# Patient Record
Sex: Male | Born: 1935 | Race: White | Hispanic: No
Health system: Southern US, Community
[De-identification: ages and names within clinical notes are randomized; demographics above are authoritative.]

---

## 2014-03-15 ENCOUNTER — Inpatient Hospital Stay: Payer: Self-pay | Admitting: Internal Medicine

## 2014-03-15 LAB — PROTIME-INR
INR: 1.3
Prothrombin Time: 15.5 secs — ABNORMAL HIGH (ref 11.5–14.7)

## 2014-03-15 LAB — CBC
HCT: 44.5 % (ref 40.0–52.0)
HGB: 14.3 g/dL (ref 13.0–18.0)
MCH: 29.5 pg (ref 26.0–34.0)
MCHC: 32.2 g/dL (ref 32.0–36.0)
MCV: 92 fL (ref 80–100)
PLATELETS: 154 10*3/uL (ref 150–440)
RBC: 4.85 10*6/uL (ref 4.40–5.90)
RDW: 14.9 % — AB (ref 11.5–14.5)
WBC: 16.5 10*3/uL — AB (ref 3.8–10.6)

## 2014-03-15 LAB — COMPREHENSIVE METABOLIC PANEL
ALK PHOS: 48 U/L
ANION GAP: 4 — AB (ref 7–16)
AST: 16 U/L (ref 15–37)
Albumin: 3.3 g/dL — ABNORMAL LOW (ref 3.4–5.0)
BILIRUBIN TOTAL: 0.7 mg/dL (ref 0.2–1.0)
BUN: 28 mg/dL — ABNORMAL HIGH (ref 7–18)
CO2: 30 mmol/L (ref 21–32)
Calcium, Total: 8.4 mg/dL — ABNORMAL LOW (ref 8.5–10.1)
Chloride: 101 mmol/L (ref 98–107)
Creatinine: 2.28 mg/dL — ABNORMAL HIGH (ref 0.60–1.30)
EGFR (African American): 36 — ABNORMAL LOW
EGFR (Non-African Amer.): 30 — ABNORMAL LOW
Glucose: 210 mg/dL — ABNORMAL HIGH (ref 65–99)
OSMOLALITY: 282 (ref 275–301)
Potassium: 5 mmol/L (ref 3.5–5.1)
SGPT (ALT): 25 U/L
SODIUM: 135 mmol/L — AB (ref 136–145)
TOTAL PROTEIN: 6.9 g/dL (ref 6.4–8.2)

## 2014-03-15 LAB — TROPONIN I
Troponin-I: <0.02 ng/mL
Troponin-I: <0.02 ng/mL
Troponin-I: <0.02 ng/mL

## 2014-03-15 LAB — URINALYSIS, COMPLETE
BACTERIA: NONE SEEN
BLOOD: NEGATIVE
Bilirubin,UR: NEGATIVE
GLUCOSE, UR: NEGATIVE mg/dL (ref 0–75)
Ketone: NEGATIVE
Leukocyte Esterase: NEGATIVE
Nitrite: NEGATIVE
PH: 5 (ref 4.5–8.0)
RBC,UR: 2 /HPF (ref 0–5)
SPECIFIC GRAVITY: 1.018 (ref 1.003–1.030)
SQUAMOUS EPITHELIAL: NONE SEEN
WBC UR: 1 /HPF (ref 0–5)

## 2014-03-15 LAB — PRO B NATRIURETIC PEPTIDE: B-TYPE NATIURETIC PEPTID: 1037 pg/mL — AB (ref 0–450)

## 2014-03-16 DIAGNOSIS — I509 Heart failure, unspecified: Secondary | ICD-10-CM

## 2014-03-16 LAB — BASIC METABOLIC PANEL
Anion Gap: 7 (ref 7–16)
BUN: 35 mg/dL — AB (ref 7–18)
Calcium, Total: 8.3 mg/dL — ABNORMAL LOW (ref 8.5–10.1)
Chloride: 103 mmol/L (ref 98–107)
Co2: 28 mmol/L (ref 21–32)
Creatinine: 2.5 mg/dL — ABNORMAL HIGH (ref 0.60–1.30)
EGFR (African American): 32 — ABNORMAL LOW
EGFR (Non-African Amer.): 27 — ABNORMAL LOW
Glucose: 218 mg/dL — ABNORMAL HIGH (ref 65–99)
Osmolality: 290 (ref 275–301)
Potassium: 4.2 mmol/L (ref 3.5–5.1)
Sodium: 138 mmol/L (ref 136–145)

## 2014-03-16 LAB — CBC WITH DIFFERENTIAL/PLATELET
Basophil #: 0.1 10*3/uL (ref 0.0–0.1)
Basophil %: 0.5 %
EOS ABS: 0.2 10*3/uL (ref 0.0–0.7)
EOS PCT: 1.3 %
HCT: 40.8 % (ref 40.0–52.0)
HGB: 13.2 g/dL (ref 13.0–18.0)
Lymphocyte #: 1.7 10*3/uL (ref 1.0–3.6)
Lymphocyte %: 13.8 %
MCH: 30 pg (ref 26.0–34.0)
MCHC: 32.4 g/dL (ref 32.0–36.0)
MCV: 93 fL (ref 80–100)
Monocyte #: 1.4 x10 3/mm — ABNORMAL HIGH (ref 0.2–1.0)
Monocyte %: 11.2 %
Neutrophil #: 9 10*3/uL — ABNORMAL HIGH (ref 1.4–6.5)
Neutrophil %: 73.2 %
Platelet: 139 10*3/uL — ABNORMAL LOW (ref 150–440)
RBC: 4.41 10*6/uL (ref 4.40–5.90)
RDW: 14.6 % — ABNORMAL HIGH (ref 11.5–14.5)
WBC: 12.3 10*3/uL — AB (ref 3.8–10.6)

## 2014-03-17 LAB — BASIC METABOLIC PANEL
ANION GAP: 8 (ref 7–16)
BUN: 43 mg/dL — AB (ref 7–18)
CO2: 24 mmol/L (ref 21–32)
Calcium, Total: 8.4 mg/dL — ABNORMAL LOW (ref 8.5–10.1)
Chloride: 103 mmol/L (ref 98–107)
Creatinine: 2.49 mg/dL — ABNORMAL HIGH (ref 0.60–1.30)
EGFR (African American): 33 — ABNORMAL LOW
EGFR (Non-African Amer.): 27 — ABNORMAL LOW
GLUCOSE: 368 mg/dL — AB (ref 65–99)
Osmolality: 296 (ref 275–301)
Potassium: 4.9 mmol/L (ref 3.5–5.1)
SODIUM: 135 mmol/L — AB (ref 136–145)

## 2014-03-18 LAB — EXPECTORATED SPUTUM ASSESSMENT W GRAM STAIN, RFLX TO RESP C

## 2014-03-20 LAB — CULTURE, BLOOD (SINGLE)

## 2014-07-12 NOTE — H&P (Signed)
PATIENT NAME:  Rodney Richards, Rodney Richards MR#:  [id number] DATE OF BIRTH:  1934-04-08  DATE OF ADMISSION:  03/15/2014  PRIMARY CARE PHYSICIAN: Dr. Christella Hartigan  PRIMARY CARDIOLOGIST: Fredia Sorrow, MD   CHIEF COMPLAINT: Chest pain.   HISTORY OF PRESENT ILLNESS: This is a 80 year old male with significant past medical history including chronic heart failure, diabetes, hypertension, CAD status post 4 stents, chronic kidney disease, Parkinson disease, who was visiting his extended family for Christmas and developed chest pain and weakness. He presented to the Emergency Department for this. He gives a history of 2 weeks of chest congestion with cough and sputum production. He states that at the beginning of this illness he had a fever up to 101 degrees, but has not had any fevers since then. He was originally seen for this in a clinic in [place], [place], where he resides. He was given some oral antibiotic; he does not recall the name of those antibiotics. He took those antibiotics for 10 days, but reports no improvement upon finishing that course of antibiotics. He had significant weakness. He states that he came to visit his family for Christmas, and this morning when he woke up, he had 1 episode of vomiting and was unable to stand due to weakness, and so presented to the ED. He does have a history of COPD and he has had significant increased sputum production with this chest congestion that he reports. In the ED, he was found to be in atrial fibrillation with rapid ventricular response. His EKG was negative for acute significant myocardial infarction. His cardiac enzymes, first set, was also negative. He was found to have an elevating creatinine at 2.2. He reports an elevated creatinine of 1.8 as his baseline. His white blood cell count was also elevated at 16. He reports significant wheezing over the course of this illness that he has had. His chest x-ray in the ED showed only some mild vascular congestion. While  in the ED, he did cough up some sputum that was a red/brown color; unclear if this was blood. He reported not having coughed up any red-tinged sputum prior to that. He was given a dose of antibiotics in the ED; and the hospitalists were contacted for admission.   PAST MEDICAL HISTORY: Congestive heart failure, type 2 diabetes, hypertension, coronary artery disease status post 4 stents, chronic kidney disease, COPD, atrial fibrillation, Parkinson disease.   PAST SURGICAL HISTORY: Cholecystectomy.  FAMILY HISTORY INCLUDES: Coronary artery disease, diabetes, hypertension, stroke, lupus.  SOCIAL HISTORY: The patient lives alone in [place], [place]. He is a former smoker with a 40 pack-year smoking history. He denies drinking any significant amount of alcohol and denies other illicit drug use.   ALLERGIES: No known drug allergies.   CURRENT MEDICATIONS: Aspirin 81 mg 1 tablet daily, atenolol 100 mg daily, bupropion XL 24-hour 150 mg daily, citalopram 40 mg daily, Eliquis 5 mg twice daily, Imdur 20 mg twice daily, Lantus 50 units every night at bedtime, lisinopril 5 mg daily, Singulair 10 mg daily, pravastatin 40 mg every night at bedtime, Ranexa 500 mg twice daily as needed, Flomax 0.4 mg daily, Victoza 1.8 mg daily, Ambien 10 mg daily.   REVIEW OF SYSTEMS:  CONSTITUTIONAL: Recent fever. Positive for weakness. Denies weight loss or weight gain.  EYES: No blurring or double vision. No redness.  EARS, NOSE, AND THROAT: No ear pain. No hearing loss. No sinus pain. No difficulty swallowing.  RESPIRATORY: Positive for cough. Positive for wheezing. Positive for sputum production.  CARDIOVASCULAR:  Positive for chest pain. Positive for orthopnea. Positive for arrhythmia. No palpitations  GASTROINTESTINAL: Positive for vomiting x1. No diarrhea. No abdominal pain. No melena.  GENITOURINARY: No dysuria. No hematuria. No incontinence.  MUSCULOSKELETAL: No significant joint swelling. No arthritis. No joint  pains.  SKIN: No rash. No lesions.  NEUROLOGIC: No numbness. No focal weakness. No headache. No seizures.  PSYCHIATRIC: Chronic insomnia. No significant depression or anxiety.  HEMATOLOGIC: No significant bruising.   PHYSICAL EXAMINATION:  VITAL SIGNS: Blood pressure 133/802, pulse of 90, respirations 22, saturation 93% on room air.  GENERAL APPEARANCE: This is a large elderly gentleman, lying supine in bed, who appears somewhat confused, though not in distress.  EYES: Pupils equal, round, and reactive to light. Extraocular movements intact. No scleral icterus.  HEENT: Moist mucosal membranes. Poor dentition with some teeth absent.  NECK: Supple, without significant cervical adenopathy. No JVD noted. No thyromegaly.  RESPIRATORY: Clear to auscultation bilaterally, without rales, wheezes, or rhonchi, in no respiratory distress. Normal respiratory effort. Anterior chest wall tender to palpation.  CARDIOVASCULAR: Controlled rate with an irregular rhythm. No murmurs, rubs, or gallops. S1, S2 present. No S3 or S4 noted. There is 1+ bilateral lower extremity edema.  GASTROINTESTINAL: Abdomen soft, nontender, nondistended. Positive bowel sounds. No organomegaly.  MUSCULOSKELETAL: No significant joint swelling. No clubbing. There is 1+ bilateral lower extremity edema.  NEUROLOGIC: Cranial nerves II through XII intact. Sensation intact throughout. No focal weakness.  SKIN: Warm and dry. No rashes or lesions. No significant bruising noted.  PSYCHIATRIC: Alert and oriented to person and place; is not oriented to time, somewhat slow to respond to questions with mild memory impairment, mild confusion. Not agitated or depressed. The patient is cooperative.   PERTINENT LABORATORIES: BNP 1037, creatinine 2.2, white blood cell count 16. Other labs largely within normal limits.   ASSESSMENT AND PLAN: The patient is a 80 year old male who presented here with chest pain and weakness and 2 weeks of cough and chest  congestion.  1. Chronic obstructive pulmonary disease exacerbation. Chest x-ray did not show any consolidation, though an atypical pneumonia component is still possible here with an elevated white blood cell count of 16. We will continue the IV antibiotics given in the Emergency Department for now, IV Rocephin and azithromycin. We will  not give any steroids at this time, though we will order p.r.n. DuoNebs for any wheezing or shortness of breath. The patient had some red-brown sputum in the ED, though his Wells score is only 1, so we will hold on diagnostics for pulmonary embolism right now as this is low on the differential. Consider further work up for this if condition worsens or does not improve with the current treatment.  2. Acute on chronic renal failure. The patient's baseline creatinine is 1.8. It has risen slightly to 2.2 here. This is likely due to his acute illness; also potentially due to his atrial fibrillation. We will hold on any direct treatment for this at this time. We will continue to monitor for now.  3. Coronary artery disease. This is a known problems, status post multiple stent placements. The patient is on Eliquis. We will continue this for now, although we will have to monitor closely his kidney function given his rising creatinine. Continue his beta blocker and his statin medications for this problem.  4. Chronic congestive heart failure. It is unclear if this is systolic or diastolic heart failure at this time. The patient has an outside cardiologist. His BNP was elevated at  about 1000 here, with an unknown baseline. His first troponins were within normal limits. He is on a daily diuretic at home. We will continue to monitor this for now. Continue his ACE inhibitor and his beta blocker.  5. atrial fibrillation with rapid ventricular response. He was rate controlled with medications in the Emergency Department. He is on Eliquis as above. We will continue his home rate controlling  medication of atenolol, and monitor this with telemetry.  6. Type 2 diabetes. He is on Lantus and Victoza  subcutaneously at home, as well as sliding scale. We will continue his Lantus and his sliding scale insulin while he is here.  7. Deep vein thrombosis prophylaxis. The patient is anticoagulated with Eliquis as above.  8. This patient is Full Code.   TIME SPENT ON THIS ADMISSION: 50 minutes.   ____________________________ Candace Cruiseavid F. Anne HahnWillis, MD dfw:MT D: 03/15/2014 15:59:00 ET T: 03/15/2014 16:30:08 ET JOB#: [id number]  cc: Candace Cruiseavid F. Anne HahnWillis, MD, <Dictator> [place]on Fetty Scotty CourtF Velina Drollinger MD ELECTRONICALLY SIGNED 03/19/2014 8:28

## 2014-07-12 NOTE — H&P (Signed)
PATIENT NAME:  Rodney Richards, Rodney Richards MR#:  [id number] DATE OF BIRTH:  06/01/1934  DATE OF ADMISSION:  03/15/2014  PRIMARY CARE PHYSICIAN: Dr. Christella Hartigan  PRIMARY CARDIOLOGIST: Fredia Sorrow, MD   CHIEF COMPLAINT: Chest pain.   HISTORY OF PRESENT ILLNESS: This is a 80 year old male with significant past medical history including chronic heart failure, diabetes, hypertension, CAD status post 4 stents, chronic kidney disease, Parkinson disease, who was visiting his extended family for Christmas and developed chest pain and weakness. He presented to the Emergency Department for this. He gives a history of 2 weeks of chest congestion with cough and sputum production. He states that at the beginning of this illness he had a fever up to 101 degrees, but has not had any fevers since then. He was originally seen for this in a clinic in [place], [place], where he resides. He was given some oral antibiotic; he does not recall the name of those antibiotics. He took those antibiotics for 10 days, but reports no improvement upon finishing that course of antibiotics. He had significant weakness. He states that he came to visit his family for Christmas, and this morning when he woke up, he had 1 episode of vomiting and was unable to stand due to weakness, and so presented to the ED. He does have a history of COPD and he has had significant increased sputum production with this chest congestion that he reports. In the ED, he was found to be in atrial fibrillation with rapid ventricular response. His EKG was negative for acute significant myocardial infarction. His cardiac enzymes, first set, was also negative. He was found to have an elevating creatinine at 2.2. He reports an elevated creatinine of 1.8 as his baseline. His white blood cell count was also elevated at 16. He reports significant wheezing over the course of this illness that he has had. His chest x-ray in the ED showed only some mild vascular congestion. While  in the ED, he did cough up some sputum that was a red/brown color; unclear if this was blood. He reported not having coughed up any red-tinged sputum prior to that. He was given a dose of antibiotics in the ED; and the hospitalists were contacted for admission.   PAST MEDICAL HISTORY: Congestive heart failure, type 2 diabetes, hypertension, coronary artery disease status post 4 stents, chronic kidney disease, COPD, atrial fibrillation, Parkinson disease.   PAST SURGICAL HISTORY: Cholecystectomy.  FAMILY HISTORY INCLUDES: Coronary artery disease, diabetes, hypertension, stroke, lupus.  SOCIAL HISTORY: The patient lives alone in [place], [place]. He is a former smoker with a 40 pack-year smoking history. He denies drinking any significant amount of alcohol and denies other illicit drug use.   ALLERGIES: No known drug allergies.   CURRENT MEDICATIONS: Aspirin 81 mg 1 tablet daily, atenolol 100 mg daily, bupropion XL 24-hour 150 mg daily, citalopram 40 mg daily, Eliquis 5 mg twice daily, Imdur 20 mg twice daily, Lantus 50 units every night at bedtime, lisinopril 5 mg daily, Singulair 10 mg daily, pravastatin 40 mg every night at bedtime, Ranexa 500 mg twice daily as needed, Flomax 0.4 mg daily, Victoza 1.8 mg daily, Ambien 10 mg daily.   REVIEW OF SYSTEMS:  CONSTITUTIONAL: Recent fever. Positive for weakness. Denies weight loss or weight gain.  EYES: No blurring or double vision. No redness.  EARS, NOSE, AND THROAT: No ear pain. No hearing loss. No sinus pain. No difficulty swallowing.  RESPIRATORY: Positive for cough. Positive for wheezing. Positive for sputum production.  CARDIOVASCULAR:  Positive for chest pain. Positive for orthopnea. Positive for arrhythmia. No palpitations  GASTROINTESTINAL: Positive for vomiting x1. No diarrhea. No abdominal pain. No melena.  GENITOURINARY: No dysuria. No hematuria. No incontinence.  MUSCULOSKELETAL: No significant joint swelling. No arthritis. No joint  pains.  SKIN: No rash. No lesions.  NEUROLOGIC: No numbness. No focal weakness. No headache. No seizures.  PSYCHIATRIC: Chronic insomnia. No significant depression or anxiety.  HEMATOLOGIC: No significant bruising.   PHYSICAL EXAMINATION:  VITAL SIGNS: Blood pressure 133/802, pulse of 90, respirations 22, saturation 93% on room air.  GENERAL APPEARANCE: This is a large elderly gentleman, lying supine in bed, who appears somewhat confused, though not in distress.  EYES: Pupils equal, round, and reactive to light. Extraocular movements intact. No scleral icterus.  HEENT: Moist mucosal membranes. Poor dentition with some teeth absent.  NECK: Supple, without significant cervical adenopathy. No JVD noted. No thyromegaly.  RESPIRATORY: Clear to auscultation bilaterally, without rales, wheezes, or rhonchi, in no respiratory distress. Normal respiratory effort. Anterior chest wall tender to palpation.  CARDIOVASCULAR: Controlled rate with an irregular rhythm. No murmurs, rubs, or gallops. S1, S2 present. No S3 or S4 noted. There is 1+ bilateral lower extremity edema.  GASTROINTESTINAL: Abdomen soft, nontender, nondistended. Positive bowel sounds. No organomegaly.  MUSCULOSKELETAL: No significant joint swelling. No clubbing. There is 1+ bilateral lower extremity edema.  NEUROLOGIC: Cranial nerves II through XII intact. Sensation intact throughout. No focal weakness.  SKIN: Warm and dry. No rashes or lesions. No significant bruising noted.  PSYCHIATRIC: Alert and oriented to person and place; is not oriented to time, somewhat slow to respond to questions with mild memory impairment, mild confusion. Not agitated or depressed. The patient is cooperative.   PERTINENT LABORATORIES: BNP 1037, creatinine 2.2, white blood cell count 16. Other labs largely within normal limits.   ASSESSMENT AND PLAN: The patient is a 80 year old male who presented here with chest pain and weakness and 2 weeks of cough and chest  congestion.  1.  Chronic obstructive pulmonary disease exacerbation. Chest x-ray did not show any consolidation, though an atypical pneumonia component is still possible here with an elevated white blood cell count of 16. We will continue the IV antibiotics given in the Emergency Department for now, IV Rocephin and azithromycin. We will  not give any steroids at this time, though we will order p.r.n. DuoNebs for any wheezing or shortness of breath. The patient had some red-brown sputum in the ED, though his Wells score is only 1, so we will hold on diagnostics for pulmonary embolism right now as this is low on the differential. Consider further work up for this if condition worsens or does not improve with the current treatment.  2.  Acute on chronic renal failure. The patient's baseline creatinine is 1.8. It has risen slightly to 2.2 here. This is likely due to his acute illness; also potentially due to his atrial fibrillation. We will hold on any direct treatment for this at this time. We will continue to monitor for now.  3.  Coronary artery disease. This is a known problems, status post multiple stent placements. The patient is on Eliquis. We will continue this for now, although we will have to monitor closely his kidney function given his rising creatinine. Continue his beta blocker and his statin medications for this problem.  4.  Chronic congestive heart failure. It is unclear if this is systolic or diastolic heart failure at this time. The patient has an outside cardiologist. His  BNP was elevated at about 1000 here, with an unknown baseline. His first troponins were within normal limits. He is on a daily diuretic at home. We will continue to monitor this for now. Continue his ACE inhibitor and his beta blocker.  5.  atrial fibrillation with rapid ventricular response. He was rate controlled with medications in the Emergency Department. He is on Eliquis as above. We will continue his home rate  controlling medication of atenolol, and monitor this with telemetry.  6.  Type 2 diabetes. He is on Lantus and Victoza  subcutaneously at home, as well as sliding scale. We will continue his Lantus and his sliding scale insulin while he is here.  7.  Deep vein thrombosis prophylaxis. The patient is anticoagulated with Eliquis as above.  8.  This patient is Full Code.   TIME SPENT ON THIS ADMISSION: 50 minutes.   ____________________________ Ellamae Sia. Sherryll Burger, MD vss:MT D: 03/15/2014 15:59:08 ET T: 03/15/2014 16:30:08 ET JOB#: [id number]  cc: Giovan Pinsky S. Sherryll Burger, MD, <Dictator>

## 2014-07-16 NOTE — Discharge Summary (Signed)
Dates of Admission and Diagnosis:  Date of Admission 15-Mar-2014   Date of Discharge 18-Mar-2014   Admitting Diagnosis Acute exacerbation of Chronic obstructive pulmonary disease.   Final Diagnosis Acute exacerbation of Chronic obstructive pulmonary disease. hypertension generalized weakness Pneumonia hemoptysis.    Chief Complaint/History of Present Illness This is a 80 year old male with significant past medical history including chronic heart failure, diabetes, hypertension, CAD status post 4 stents, chronic kidney disease, Parkinson disease, who was visiting his extended family for Christmas and developed chest pain and weakness. He presented to the Emergency Department for this. He gives a history of 2 weeks of chest congestion with cough and sputum production. He states that at the beginning of this illness he had a fever up to 101 degrees, but has not had any fevers since then. He was originally seen for this in a clinic in [place], [place] , where he [place]. He was given some oral antibiotic; he does not recall the name of those antibiotics. He took those antibiotics for 10 days, but reports no improvement upon finishing that course of antibiotics. He had significant weakness. He states that he came to visit his family for Christmas, and this morning when he woke up, he had 1 episode of vomiting and was unable to stand due to weakness, and so presented to the ED. He does have a history of COPD and he has had significant increased sputum production with this chest congestion that he reports. In the ED, he was found to be in atrial fibrillation with rapid ventricular response. His EKG was negative for acute significant myocardial infarction.His cardiac enzymes, first set, was also negative. He was found to have an elevating creatinine at 2.2. He reports an elevated creatinine of 1.8 as his baseline. His white blood cell count was also elevated at 16. He reports significant wheezing over  the course of this illness that he has had. His chest x-ray in the ED showed only some mild vascular congestion. While in the ED, he did cough up some sputum that was a red/brown color; unclear if this was blood. He reported not having coughed up any red-tinged sputum prior to that. He was given a dose of antibiotics in the ED; and the hospitalists were contacted for admission.   Allergies:  No Known Allergies:   Pertinent Past History:  Pertinent Past History Congestive heart failure, type 2 diabetes, hypertension, coronary artery disease status post 4 stents, chronic kidney disease, COPD, atrial fibrillation, Parkinson disease.   Hospital Course:  Hospital Course * Chronic obstructive Pulmonary disease exacerbation- evidence of pneumonia on CT chest. likely present since admission.   IV steroids, Spiriva, Nebs, Rocephin IV   initially there was No evidense of Pneumonia on Xray.    normal floora per sputum culture.   hemoptysis is getting better now, still have mild cough.   Appreciated pulmonary consult.   will discharge home today, on room air.  *  Acute on chronic renal failure.    baseline creatinine is 1.8. It has risen slightly to 2.4 here.  likely due to his acute illness; also potentially due to his atrial fibrillation.    hold on any nephrotoxic meds, continue to monitor for now. Can follow with Nephrology- he has appointment next week.  *  Coronary artery disease.    status post multiple stent placements. on Eliquis. On beta blocker and statin.  * Chronic congestive heart failure.  unclear if this is systolic or diastolic heart failure at this time.  On oral daily diuretics, No sign of exacerbation.Continue his ACE inhibitor and his beta blocker.   * atrial fibrillation with rapid ventricular response.    rate controlled with medications in the Emergency Department. on Eliquis  continue his home rate controlling medication of atenolol, and monitor this with telemetry.    * Type 2 diabetes. He is on Lantus and Victoza  subcutaneously at home, as well as sliding scale. We will continue his Lantus and his sliding scale insulin while he is here.   *  Deep vein thrombosis prophylaxis. The patient is anticoagulated with Eliquis as above. PT eval- he may need rehab- bus he strongly refuse that- have [facility]HA , PT and nursing at home with [facility] CM to arrange home services. d/c home today.   Condition on Discharge Stable   DISCHARGE INSTRUCTIONS HOME MEDS:  Medication Reconciliation: Patient's Home Medications at Discharge:     Medication Instructions  citalopram 40 mg oral tablet  1 tab(s) orally once a day   eliquis 5 mg oral tablet  1 tab(s) orally 2 times a day   pravastatin 40 mg oral tablet  1 tab(s) orally once a day (at bedtime)   ranexa 500 mg oral tablet, extended release  1 tab(s) orally 2 times a day   victoza 18 mg/3 ml subcutaneous solution  1.8 milligram(s) subcutaneous once a day   aspirin enteric coated 81 mg oral delayed release tablet  1 tab(s) orally once a day   bupropion 150 mg/24 hours oral tablet, extended release  1 tab(s) orally once a day   isosorbide mononitrate 20 mg oral tablet  1 tab(s) orally 2 times a day   lantus solostar pen 100 units/ml subcutaneous solution  50 unit(s) subcutaneous once a day (at bedtime)   montelukast 10 mg oral tablet  1 tab(s) orally once a day, As Needed   proair hfa cfc free 90 mcg/inh inhalation aerosol  2 puff(s) inhaled every 4 to 6 hours as needed for wheezing.   tamsulosin 0.4 mg oral capsule  1 cap(s) orally once a day   zolpidem 10 mg oral tablet  1 tab(s) orally once a day (at bedtime)   prednisone 20 mg oral tablet  2 tab(s) orally once a day x 4 days    acetaminophen-oxycodone 325 mg-5 mg oral tablet  1 tab(s) orally every 4 hours x 3 days, As Needed, moderate pain (4-6/10) , moderate pain (4-6/10) , As needed, moderate pain (4-6/10)   lisinopril 5 mg oral tablet  1 tab(s) orally once a day    tiotropium 18 mcg inhalation capsule  1 cap(s) inhaled once a day   ceftin 500 mg oral tablet  1 tab(s) orally 2 times a day x 3 days      Physician's Instructions:  Home Health? Yes   Home Health Service Physicial Therapy  Nurse   Diet Low Sodium  Carbohydrate Controlled (ADA) Diet   Activity Limitations As tolerated   Return to Work Not Applicable   Time frame for Follow Up Appointment 1-2 weeks  PMD   Other Comments follow with PMD in [place] in 1 week.   TIME SPENT:  Total Time: Greater than 30 minutes   Electronic Signatures: Altamese Dilling (MD)  (Signed 01-Jan-16 08:59)  Authored: ADMISSION DATE AND DIAGNOSIS, CHIEF COMPLAINT/HPI, Allergies, PERTINENT PAST HISTORY, HOSPITAL COURSE, DISCHARGE INSTRUCTIONS HOME MEDS, PATIENT INSTRUCTIONS, TIME SPENT   Last Updated: 01-Jan-16 08:59 by Altamese Dilling (MD)

## 2015-08-17 IMAGING — CR DG CHEST 1V PORT
1 series · 1 of 1 positions shown · non-contrast
Comparison: None.

CLINICAL DATA: Shortness of breath and uncontrollable shaking.

EXAM:
PORTABLE CHEST - 1 VIEW

[ap]
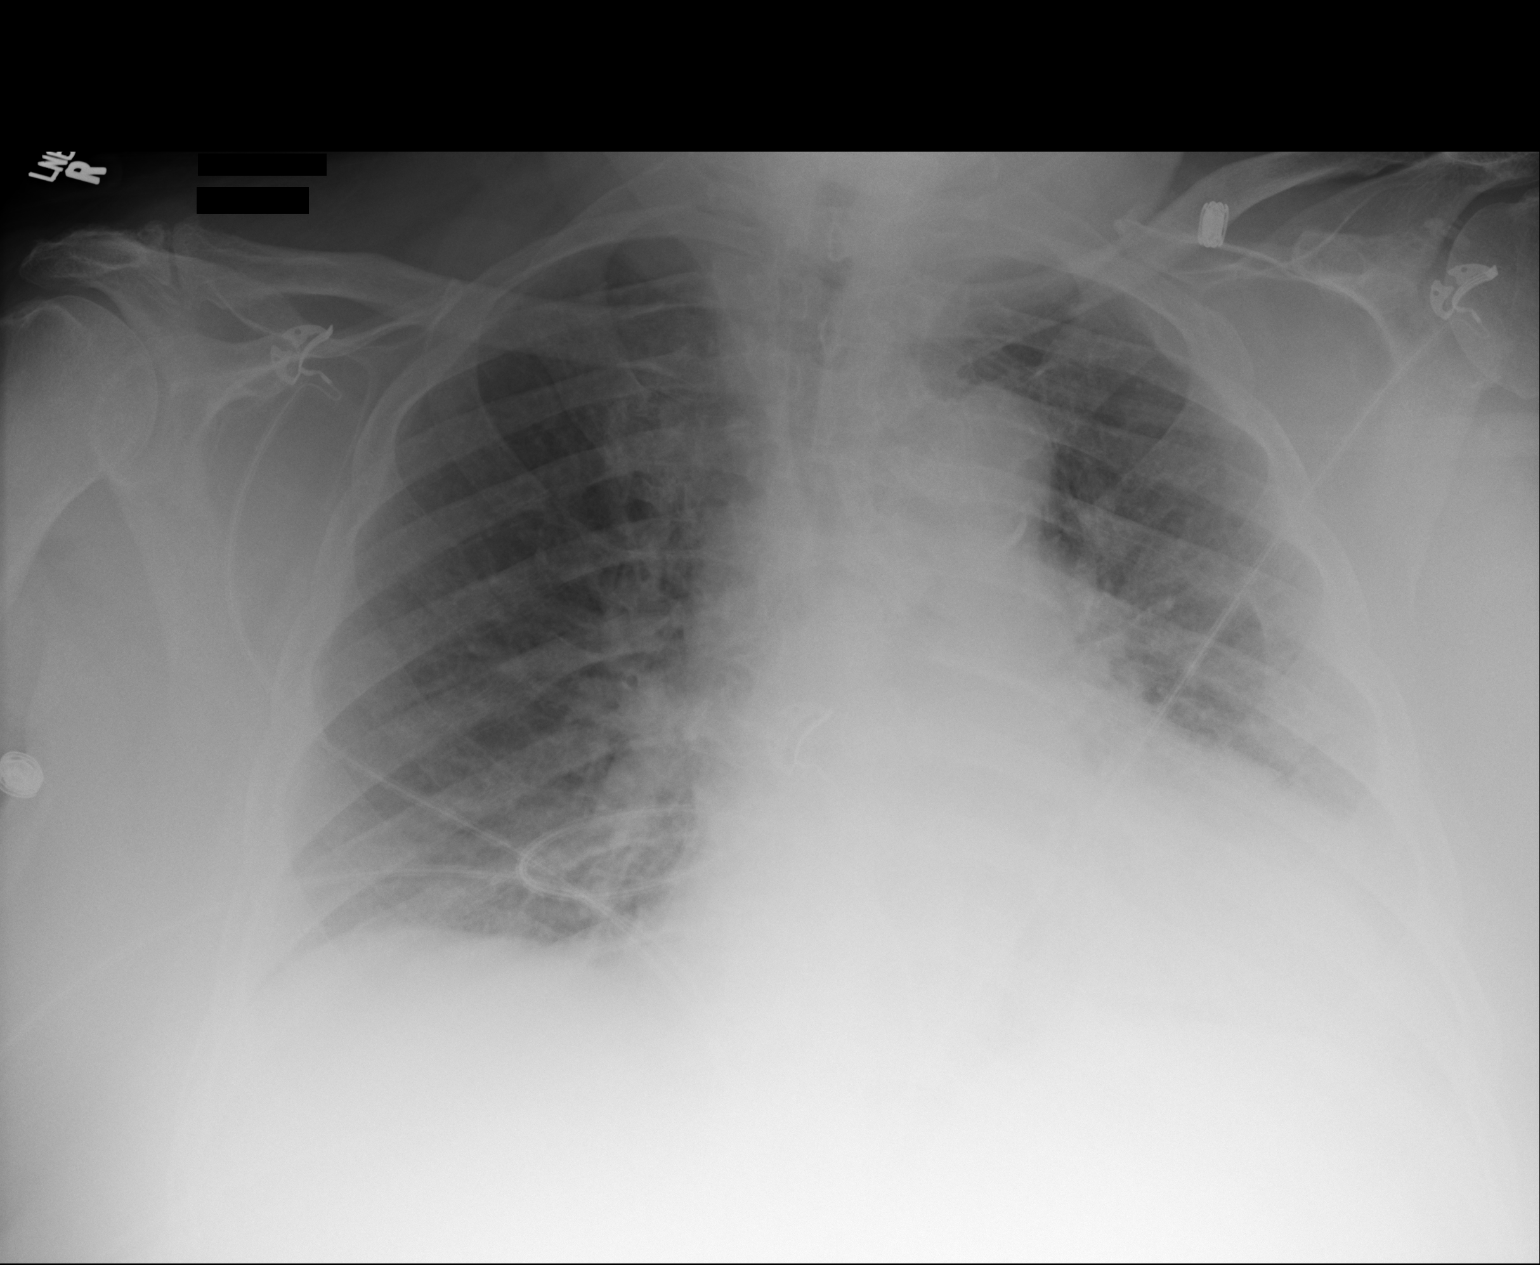

[1 of 1 positions shown; findings below may reference images not displayed]

FINDINGS: Lungs are hypoinflated as patient is slightly rotated to the right.
There is mild prominence of the perihilar markings suggesting mild
vascular congestion. Borderline cardiomegaly. Mild calcified plaque
over the aortic arch. Degenerative changes of the spine.
IMPRESSION: Hypoinflation with suggestion of mild vascular congestion.

## 2016-02-19 DEATH — deceased
